# Patient Record
Sex: Female | Born: 1985 | Race: White | Hispanic: No | Marital: Single | Smoking: Never smoker
Health system: Southern US, Community
[De-identification: ages and names within clinical notes are randomized; demographics above are authoritative.]

## PROBLEM LIST (undated history)

## (undated) DIAGNOSIS — E7522 Gaucher disease: Secondary | ICD-10-CM

## (undated) DIAGNOSIS — T7840XA Allergy, unspecified, initial encounter: Secondary | ICD-10-CM

## (undated) HISTORY — DX: Gaucher disease: E75.22

## (undated) HISTORY — DX: Allergy, unspecified, initial encounter: T78.40XA

## (undated) HISTORY — PX: KNEE SURGERY: SHX244

---
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
## 2007-04-25 ENCOUNTER — Ambulatory Visit (HOSPITAL_COMMUNITY): Admission: RE | Admit: 2007-04-25 | Discharge: 2007-04-25 | Payer: Self-pay | Admitting: Orthopedic Surgery

## 2008-06-25 ENCOUNTER — Ambulatory Visit (HOSPITAL_COMMUNITY): Admission: RE | Admit: 2008-06-25 | Discharge: 2008-06-25 | Payer: Self-pay

## 2008-07-11 ENCOUNTER — Encounter: Admission: RE | Admit: 2008-07-11 | Discharge: 2008-07-11 | Payer: Self-pay | Admitting: Endocrinology

## 2008-09-02 ENCOUNTER — Encounter (HOSPITAL_COMMUNITY): Admission: RE | Admit: 2008-09-02 | Discharge: 2008-12-01 | Payer: Self-pay | Admitting: Geriatric Medicine

## 2009-08-04 ENCOUNTER — Ambulatory Visit (HOSPITAL_COMMUNITY): Admission: RE | Admit: 2009-08-04 | Discharge: 2009-08-04 | Payer: Self-pay | Admitting: Pediatrics

## 2009-08-06 ENCOUNTER — Encounter (HOSPITAL_COMMUNITY): Admission: RE | Admit: 2009-08-06 | Discharge: 2009-11-04 | Payer: Self-pay | Admitting: Pediatrics

## 2009-11-09 ENCOUNTER — Encounter (HOSPITAL_COMMUNITY): Admission: RE | Admit: 2009-11-09 | Discharge: 2010-02-07 | Payer: Self-pay | Admitting: Pediatrics

## 2010-03-21 ENCOUNTER — Encounter (HOSPITAL_COMMUNITY)
Admission: RE | Admit: 2010-03-21 | Discharge: 2010-06-19 | Payer: Self-pay | Source: Home / Self Care | Attending: Pediatrics | Admitting: Pediatrics

## 2010-06-12 ENCOUNTER — Encounter: Payer: Self-pay | Admitting: Pediatrics

## 2010-07-18 ENCOUNTER — Encounter (HOSPITAL_COMMUNITY): Payer: Self-pay | Attending: Pediatrics

## 2010-07-18 DIAGNOSIS — E756 Lipid storage disorder, unspecified: Secondary | ICD-10-CM | POA: Insufficient documentation

## 2010-08-02 ENCOUNTER — Encounter (HOSPITAL_COMMUNITY): Payer: Self-pay

## 2010-08-09 ENCOUNTER — Encounter (HOSPITAL_COMMUNITY): Payer: Self-pay

## 2010-10-07 NOTE — Op Note (Signed)
NAMEVINNIE, BOBST NO.:  [id number]   MEDICAL RECORD NO.:  [id number]          PATIENT TYPE:  AMB   LOCATION:  DSC                          FACILITY:  [facility]   PHYSICIAN:  Loreta Ave, M.D. 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   PROCEDURE:  Right knee examined under anesthesia, arthroscopy, chondroplasty  of patella, partial medial meniscectomy.  Assessment of tracking. Open  medial reefing of medial patellofemoral ligament and vastus medialis  obliquus.  Lateral retinacular release.   SURGEON:  Loreta Ave, M.D.   ASSISTANT:  Garnette Scheuermann, P.A.   ANESTHESIA:  General.   BLOOD LOSS:  Minimal.   TOURNIQUET TIME:  One hour.   SPECIMENS:  None.   CULTURES:  None.   COMPLICATIONS:  None.   DRESSING:  Soft compressive with knee immobilizer.   PROCEDURE:  The patient brought to the operating room and placed on the  operating table in the supine position.  After adequate anesthesia had been  obtained, both legs examined.  Confirmation of lateral instability, lateral  tracking, tethering, right knee much greater than left at the patellofemoral  joint.  All other ligaments stable to full motion.  Tourniquet applied,  prepped and draped in the usual sterile fashion.  Exsanguinated with the  elevation of the Esmarch, tourniquet inflated to 300 mmHg.  Two portals, one  each medial and lateral parapatellar.  Inflow catheter was attached to the  arthroscope.  The arthroscope was introduced  and the knee inspected.  Grade  3 focal traumatic chondromalacia peak of the patella debrided.  Very shallow  trochlea.  Confirmation of marked lateral instability with lateral tracking  and chronic enough that there was now lateral tethering, so I really could  not even reduce because of tightness laterally.  Articular cartilage in the  trochlea, medial and lateral compartments intact.  Flap tear, anteromedial  meniscus debrided.  Knee thoroughly inspected.  Instruments were completely  removed.  A longitudinal incision made medial to the patella.  Skin and  subcutaneous tissue divided.  Medial structures taken down in two layers.  The VMO, superficial fascia were taken from the top of the patella down to  the inferior pole and retracted so that I had a flap medial and lateral.  The underlying medial patellofemoral ligament identified, incised from above  the patella down to the joint line and then reefed a centimeter to tighten  this structure as it was very willowy, stretched out, and nonfunctional.  Sutured in place firmly with nonabsorbable Ethibond suture.  When that was  completed, I had marked improved stability and flexion better than 100  degrees without undue tension.  I then reefed the superficial structures in  the same manner with numerous nonabsorbable sutures.  Again, marked  improvement of stability with motion better than 100 degrees without too  much tension.  Arthroscope was reintroduced.  Confirmation of good  tightening of the medial side, but this really compressed the patellofemoral  joint because of the lateral tethering.  I therefore did a lateral release  from the vastus lateralis superiorly down to the lateral joint line  inferiorly with the hooked cautery.  When that was completed, I had  excellent tracking throughout viewed visually as well as arthroscopically.  Hemostasis had been obtained with cautery.  Portals were closed with nylon  and injected with  Marcaine as was the knee.  The incision was then closed  with subcutaneous, subcuticular Vicryl and Steri-Strips.  Margins were  injected with Marcaine.  Sterile compressive dressing applied.  Tourniquet  deflated and removed.  Knee immobilizer applied.  Anesthesia reversed.  Left  the recovery room.  Tolerated the surgery well with no complications.       DFM/MEDQ  D:  11/07/2004  T:  11/07/2004  Job:  [id number]

## 2010-10-07 NOTE — Op Note (Signed)
[facility]  Patient:    Kathryn Harrell, Kathryn Harrell                         MRN: [id number] Proc. Date: 12/14/00 Adm. Date:  [id number] Attending:  F[place] Pho Damodar CC:         Jeni Salles, M.D.   Operative Report  PREOPERATIVE DIAGNOSES: 1. Port-A-Cath of right chest. 2. Gauchers disease.  POSTOPERATIVE DIAGNOSES: 1. Port-A-Cath of right chest. 2. Gauchers disease.  PROCEDURE:  Removal of Port-A-Cath, right chest.  SURGEON:  Prabhakar D. Levie Heritage, M.D.  ASSISTANT:  Nurse.  ANESTHESIA:  Nurse.  DESCRIPTION OF PROCEDURE:  Under satisfactory general anesthesia, patient in supine position, the right chest region was thoroughly prepped and draped in the usual manner.  Previous scar over the right upper chest was excised by elliptical incision, skin and subcutaneous tissue incised.  Skin was undermined, Port-A-Cath was located.  The capsule of the Port-A-Cath was incised and Port-A-Cath was freed from the chest wall structures, and by gentle traction the catheter was removed intact with the Port-A-Cath.  The wound was irrigated, hemostasis accomplished.  Deeper layers were approximated with 4-0 Vicryl interrupted sutures, skin closed with 5-0 nylon interrupted sutures, appropriate dressing applied.  Throughout the procedure, patients vital signs remained stable.  Patient withstood the procedure well and was transferred to recovery room in satisfactory general condition. DD:  12/14/00 TD:  12/14/00 Job: [id number]

## 2011-12-12 ENCOUNTER — Other Ambulatory Visit (HOSPITAL_COMMUNITY): Payer: Self-pay | Admitting: *Deleted

## 2011-12-12 DIAGNOSIS — E756 Lipid storage disorder, unspecified: Secondary | ICD-10-CM

## 2011-12-18 ENCOUNTER — Ambulatory Visit (HOSPITAL_COMMUNITY)
Admission: RE | Admit: 2011-12-18 | Discharge: 2011-12-18 | Disposition: A | Discharge status: Home / Self Care | Payer: Self-pay | Source: Ambulatory Visit | Attending: Pediatrics | Admitting: Pediatrics

## 2011-12-18 ENCOUNTER — Ambulatory Visit (HOSPITAL_COMMUNITY)
Admission: RE | Admit: 2011-12-18 | Discharge: 2011-12-18 | Disposition: A | Discharge status: Home / Self Care | Payer: Self-pay | Source: Ambulatory Visit | Attending: *Deleted | Admitting: *Deleted

## 2011-12-18 ENCOUNTER — Other Ambulatory Visit (HOSPITAL_COMMUNITY): Payer: Self-pay | Admitting: *Deleted

## 2011-12-18 DIAGNOSIS — Z09 Encounter for follow-up examination after completed treatment for conditions other than malignant neoplasm: Secondary | ICD-10-CM | POA: Insufficient documentation

## 2011-12-18 DIAGNOSIS — E756 Lipid storage disorder, unspecified: Secondary | ICD-10-CM

## 2011-12-26 ENCOUNTER — Other Ambulatory Visit: Payer: Self-pay | Admitting: *Deleted

## 2011-12-26 ENCOUNTER — Other Ambulatory Visit (HOSPITAL_COMMUNITY): Payer: Self-pay | Admitting: *Deleted

## 2011-12-26 DIAGNOSIS — E229 Hyperfunction of pituitary gland, unspecified: Secondary | ICD-10-CM

## 2012-01-01 ENCOUNTER — Inpatient Hospital Stay (HOSPITAL_COMMUNITY)
Admission: RE | Admit: 2012-01-01 | Discharge: 2012-01-01 | Payer: Self-pay | Source: Ambulatory Visit | Attending: *Deleted | Admitting: *Deleted

## 2012-04-15 ENCOUNTER — Ambulatory Visit (INDEPENDENT_AMBULATORY_CARE_PROVIDER_SITE_OTHER): Payer: 59 | Admitting: Family Medicine

## 2012-04-15 VITALS — BP 98/72 | HR 78 | Temp 98.5°F | Resp 18 | Ht 61.75 in | Wt 185.6 lb

## 2012-04-15 DIAGNOSIS — Z23 Encounter for immunization: Secondary | ICD-10-CM

## 2012-04-15 DIAGNOSIS — Z0000 Encounter for general adult medical examination without abnormal findings: Secondary | ICD-10-CM

## 2012-04-15 DIAGNOSIS — E7522 Gaucher disease: Secondary | ICD-10-CM

## 2012-04-15 NOTE — Patient Instructions (Addendum)
Take care and good luck with your certification.

## 2012-04-15 NOTE — Progress Notes (Signed)
[facility] [address], [place] [address] [phone]  Date:  04/15/2012   Name:  Kathryn Harrell   DOB:  [birth year]   MRN:  [id number]  PCP:  Cyndee Brightly    Chief Complaint: Annual Exam   History of Present Illness:  Kathryn Harrell is a 26 y.o. 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 She therefore needs a PE and TB screening, as well as a varicella titer.  She was diagnosed with Gauchers disease at 26 years old. She takes Elelyso IV every 2 weeks and sees a doctor at [facility] every 6 months.  Otherwise she is generally healthy. The Gauchers disease does not generally effect her lifestyle or function  She had the chicken pox as a child and also has had her Hep B series.    LMP 03/24/12  There is no problem list on file for this patient.   Past Medical History  Diagnosis Date  . Allergy     Past Surgical History  Procedure Date  . Knee surgery     History  Substance Use Topics  . Smoking status: Never Smoker   . Smokeless tobacco: Not on file  . Alcohol Use: Not on file    No family history on file.  Allergies  Allergen Reactions  . Amoxicillin Hives  . Penicillins Hives    Medication list has been reviewed and updated.  No current outpatient prescriptions on file prior to visit.    Review of Systems:  As per HPI- otherwise negative.   Physical Examination: Filed Vitals:   04/15/12 1301  BP: 98/72  Pulse: 78  Temp: 98.5 F (36.9 C)  Resp: 18   Filed Vitals:   04/15/12 1301  Height: 5' 1.75" (1.568 m)  Weight: 185 lb 9.6 oz (84.188 kg)   Body mass index is 34.22 kg/(m^2). Ideal Body Weight: Weight in (lb) to have BMI = 25: 135.3   GEN: WDWN, NAD, Non-toxic, A & O x 3 HEENT:  Atraumatic, Normocephalic. Neck supple. No masses, No LAD. Bilateral TM wnl, oropharynx normal.  PEERL,EOMI.   Ears and Nose: No external deformity. CV: RRR, No M/G/R. No JVD. No thrill. No extra heart sounds. PULM: CTA B, no wheezes, crackles, rhonchi. No retractions. No resp. distress. No accessory muscle use. ABD: S, NT, ND, +BS. No rebound. No HSM. EXTR: No c/c/e.  Normal strength and sensation in all extremities, normal back flexion and ROM NEURO Normal gait.  PSYCH: Normally interactive. Conversant. Not depressed or anxious appearing.  Calm demeanor.    Tuberculosis Risk Questionnaire  1. Were you born outside the [place] in one of the following parts of the world:    [place], [place], [place], [place] or [place]?  No  2. Have you traveled outside the [place] and lived for more than one month in one of the following parts of the world:  [place], [place], [place], [place] or [place]?  No  3. Do you have a compromised immune system such as from any of the following conditions:  HIV/AIDS, organ or bone marrow transplantation, diabetes, immunosuppressive   medicines (e.g. Prednisone, Remicaide), leukemia, lymphoma, cancer of the   head or neck, gastrectomy or jejunal  bypass, end-stage renal disease (on   dialysis), or silicosis?  No    4. Have you ever done one of the following:    Used crack cocaine, injected illegal drugs, worked or resided in jail or prison,   worked or resided at a homeless shelter, or worked as a Research scientist (physical sciences) in   direct contact with patients?  No  5. Have you ever been exposed to anyone with infectious tuberculosis?  No   Tuberculosis Symptom Questionnaire  Do you currently have any of the following symptoms?  1. Unexplained cough lasting more than 3 weeks? No  Unexplained fever lasting more than 3 weeks. No   3. Night Sweats (sweating that leaves the bedclothes and sheets wet)   No  4. Shortness of Breath No  5.  Chest Pain No  6. Unintentional weight loss  No  7. Unexplained fatigue (very tired for no reason) No   Assessment and Plan: 1. Physical exam, annual   PE for [facility]- EMS program today.  TB screening negative, gave letter   Gave flu shot today.  Varicella and hep b titers pending.   Abbe Amsterdam, MD

## 2012-04-16 LAB — VARICELLA ZOSTER ANTIBODY, IGG: Varicella IgG: 2.73 {ISR} — ABNORMAL HIGH (ref <0.90)

## 2012-04-17 ENCOUNTER — Telehealth: Payer: Self-pay | Admitting: Family Medicine

## 2012-04-17 NOTE — Telephone Encounter (Signed)
Called Pt- LMOM stating she is immune to Hep B and Chicken pox. Mailed a copy of labs to Pt today. JF

## 2013-10-15 ENCOUNTER — Other Ambulatory Visit (HOSPITAL_COMMUNITY): Payer: Self-pay | Admitting: Pediatrics

## 2013-10-15 DIAGNOSIS — E7522 Gaucher disease: Secondary | ICD-10-CM

## 2013-10-23 ENCOUNTER — Ambulatory Visit (HOSPITAL_COMMUNITY)
Admission: RE | Admit: 2013-10-23 | Discharge: 2013-10-23 | Disposition: A | Discharge status: Home / Self Care | Payer: 59 | Source: Ambulatory Visit | Attending: Pediatrics | Admitting: Pediatrics

## 2013-10-23 ENCOUNTER — Ambulatory Visit (HOSPITAL_COMMUNITY): Admission: RE | Admit: 2013-10-23 | Payer: 59 | Source: Ambulatory Visit

## 2013-10-23 DIAGNOSIS — E756 Lipid storage disorder, unspecified: Secondary | ICD-10-CM | POA: Insufficient documentation

## 2013-10-23 DIAGNOSIS — E7522 Gaucher disease: Secondary | ICD-10-CM

## 2013-10-23 MED ORDER — GADOXETATE DISODIUM 0.25 MMOL/ML IV SOLN
9.0000 mL | Freq: Once | INTRAVENOUS | Status: AC | PRN
  Administered 2013-10-23: 9 mL via INTRAVENOUS

## 2013-11-10 ENCOUNTER — Ambulatory Visit (HOSPITAL_COMMUNITY)
Admission: RE | Admit: 2013-11-10 | Discharge: 2013-11-10 | Disposition: A | Discharge status: Home / Self Care | Payer: 59 | Source: Ambulatory Visit | Attending: Pediatrics | Admitting: Pediatrics

## 2013-11-10 DIAGNOSIS — Z13820 Encounter for screening for osteoporosis: Secondary | ICD-10-CM | POA: Insufficient documentation

## 2013-11-10 DIAGNOSIS — E7522 Gaucher disease: Secondary | ICD-10-CM

## 2013-11-10 DIAGNOSIS — E756 Lipid storage disorder, unspecified: Secondary | ICD-10-CM | POA: Insufficient documentation

## 2013-11-12 ENCOUNTER — Other Ambulatory Visit (HOSPITAL_COMMUNITY): Payer: Self-pay | Admitting: Pediatrics

## 2013-11-12 DIAGNOSIS — Z00129 Encounter for routine child health examination without abnormal findings: Secondary | ICD-10-CM

## 2013-11-13 ENCOUNTER — Ambulatory Visit (HOSPITAL_COMMUNITY)
Admission: RE | Admit: 2013-11-13 | Discharge: 2013-11-13 | Disposition: A | Discharge status: Home / Self Care | Payer: 59 | Source: Ambulatory Visit | Attending: Pediatrics | Admitting: Pediatrics

## 2013-11-13 DIAGNOSIS — I4510 Unspecified right bundle-branch block: Secondary | ICD-10-CM | POA: Insufficient documentation

## 2013-11-13 DIAGNOSIS — Z00129 Encounter for routine child health examination without abnormal findings: Secondary | ICD-10-CM | POA: Insufficient documentation

## 2013-11-13 DIAGNOSIS — I498 Other specified cardiac arrhythmias: Secondary | ICD-10-CM | POA: Insufficient documentation

## 2014-01-08 ENCOUNTER — Ambulatory Visit (INDEPENDENT_AMBULATORY_CARE_PROVIDER_SITE_OTHER): Payer: 59 | Admitting: Cardiovascular Disease

## 2014-01-08 ENCOUNTER — Encounter: Payer: Self-pay | Admitting: Cardiovascular Disease

## 2014-01-08 VITALS — BP 118/78 | HR 99 | Ht 63.0 in | Wt 202.0 lb

## 2014-01-08 DIAGNOSIS — R9431 Abnormal electrocardiogram [ECG] [EKG]: Secondary | ICD-10-CM | POA: Insufficient documentation

## 2014-01-08 DIAGNOSIS — E7522 Gaucher disease: Secondary | ICD-10-CM

## 2014-01-08 DIAGNOSIS — E756 Lipid storage disorder, unspecified: Secondary | ICD-10-CM

## 2014-01-08 DIAGNOSIS — Z01810 Encounter for preprocedural cardiovascular examination: Secondary | ICD-10-CM

## 2014-01-08 LAB — BASIC METABOLIC PANEL
BUN: 11 mg/dL (ref 6–23)
CO2: 26 mEq/L (ref 19–32)
Calcium: 9.2 mg/dL (ref 8.4–10.5)
Chloride: 105 mEq/L (ref 96–112)
Creatinine, Ser: 0.9 mg/dL (ref 0.4–1.2)
GFR: 78.52 mL/min (ref >60.00)
Glucose, Bld: 94 mg/dL (ref 70–99)
Potassium: 3.6 mEq/L (ref 3.5–5.1)
SODIUM: 135 meq/L (ref 135–145)

## 2014-01-08 NOTE — Patient Instructions (Addendum)
The current medical regimen is effective;  continue present plan and medications.  Please have blood work today (BMP)  Your physician has requested that you have a cardiac MRI. Cardiac MRI uses a computer to create images of your heart as its beating, producing both still and moving pictures of your heart and major blood vessels. For further information please visit [email or web address]. Please follow the instruction sheet given to you today for more information.  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 (8/25)

## 2014-01-08 NOTE — Assessment & Plan Note (Signed)
Re[facility].  Bone disease seems stable by MRI's.  She has had insurance issues and logistic issues with biweekly iv injection Rx.  Clearly would be beneficial to transition from iv to oral RX

## 2014-01-08 NOTE — Assessment & Plan Note (Addendum)
He ECG has transient and mild QT abnormalities.  She is asymptomatic with no palpitations or syncope and no high risk cardiac family history.  I am not familiar with this drug.  I have done a quick literature search and cardiac arrhythmias related to long QT seem very infrequent and not quoted as over 1% anywhere.  Her initial corrected QT of 444 is in normal range with other non significant ST changes and ICRBBB.  Her ECG today interestingly at much higher HR of 99 shows a slightly longer QTc of 469.  We will order a cardiac MRI to r/o structural heart disease and valve disease.  I have asked Dr Graciela HusbandsKlein to see patient and review after MRI.  I would be inclined to let her transition to drug since the benefit of stopping biweekly iv Rx is huge.  The question is should she have a provocative test with adenosine or flecainide or initially start the drug in the hospital given her borderline ECG.  Her complete lack of family history or symptoms again makes me think outpatient initiation of drug would be ok.

## 2014-01-08 NOTE — Progress Notes (Signed)
Patient ID: Kathryn Harrell, female   DOB: Mar 25, 1986, 28 y.o.   MRN: [id number]    28 yo [facility] Sabino NiemannKishnani.  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  There is no family history of sudden death or long QT .  She has been Rx with biweekly iv injections since age 19nine.  I believe this involves Elelyso enzyme replacement Rx.  She continues to have diffuse marrow processes by MRI and needs ongoing maintanence RX.  She wants to transition to oral  Cerdelga Initial  She takes NSAI's but no other drugs routinely and no drugs that prolong QT  ECG done 6/25 showed   SR rate 69 Low voltage ICRBBB and QT 412/QTc 441 Today ECG SR rate 99 nonspecific ST changes QT 366 QTc 469  Cerdelga has a warning for ECG changes and for potential cardiac arrhythmias and is not recommended for patients with preexisting heart disease or long QT syndrome   Labs done earlier this month  K 3.6, Hct 38 Cr .9  LDL 91 PLT 127    ROS: Denies fever, malais, weight loss, blurry vision, decreased visual acuity, cough, sputum, SOB, hemoptysis, pleuritic pain, palpitaitons, heartburn, abdominal pain, melena, lower extremity edema, claudication, or rash.  All other systems reviewed and negative   General: Affect appropriate Overweight white female  HEENT: normal Neck supple with no adenopathy JVP normal no bruits no thyromegaly Lungs clear with no wheezing and good diaphragmatic motion Heart:  S1/S2 no murmur,rub, gallop or click PMI normal Abdomen: benighn, BS positve, no tenderness, no AAA no bruit.  No HSM or HJR Distal pulses intact with no bruits No edema Neuro  non-focal Skin warm and dry No muscular weakness  Medications Current Outpatient Prescriptions  Medication Sig Dispense Refill  . Acetaminophen 500 MG coapsule Take by mouth.      . doxycycline (VIBRA-TABS) 100 MG tablet Take 100 mg by mouth.      Marland Kitchen. ibuprofen (ADVIL,MOTRIN) 200 MG tablet Take by mouth.      . Multiple Vitamin (MULTIVITAMIN) capsule Take by mouth.      . naproxen sodium (RA NAPROXEN SODIUM) 220 MG tablet Take by mouth.      . Omega-3 Fatty Acids (FISH OIL) 1000 MG CAPS Take by mouth.      . Taliglucerase Alfa (ELELYSO) 200 UNITS SOLR Inject 3,400 Units into the vein every 14 (fourteen) days.      . Taliglucerase Alfa (ELELYSO) 200 UNITS SOLR Inject into the vein.       No current facility-administered medications for this visit.    Allergies Amoxicillin; Penicillins; and Topiramate  Family History: No family history on file.  Social History: History   Social History  . Marital Status: Single    Spouse Name: N/A    Number of Children: N/A  . Years of Education: N/A   Occupational History  . Not on file.   Social History Main Topics  . Smoking status: Never Smoker   . Smokeless tobacco: Not on file  . Alcohol Use: Not on file  . Drug Use: Not on file  .  Sexual Activity: Not on file   Other Topics Concern  . Not on file   Social History Narrative  . No narrative on file    Electrocardiogram:  SR ICRBBB   Assessment and Plan

## 2014-01-09 ENCOUNTER — Ambulatory Visit (HOSPITAL_COMMUNITY)
Admission: RE | Admit: 2014-01-09 | Discharge: 2014-01-09 | Disposition: A | Discharge status: Home / Self Care | Payer: 59 | Source: Ambulatory Visit | Attending: Cardiovascular Disease | Admitting: Cardiovascular Disease

## 2014-01-09 DIAGNOSIS — E756 Lipid storage disorder, unspecified: Secondary | ICD-10-CM | POA: Insufficient documentation

## 2014-01-09 DIAGNOSIS — I079 Rheumatic tricuspid valve disease, unspecified: Secondary | ICD-10-CM | POA: Diagnosis not present

## 2014-01-09 DIAGNOSIS — E7522 Gaucher disease: Secondary | ICD-10-CM

## 2014-01-13 ENCOUNTER — Ambulatory Visit (INDEPENDENT_AMBULATORY_CARE_PROVIDER_SITE_OTHER): Payer: 59 | Admitting: Internal Medicine

## 2014-01-13 ENCOUNTER — Encounter: Payer: Self-pay | Admitting: Internal Medicine

## 2014-01-13 ENCOUNTER — Telehealth: Payer: Self-pay | Admitting: Internal Medicine

## 2014-01-13 VITALS — BP 113/81 | HR 96 | Ht 62.0 in | Wt 202.4 lb

## 2014-01-13 DIAGNOSIS — R9431 Abnormal electrocardiogram [ECG] [EKG]: Secondary | ICD-10-CM

## 2014-01-13 NOTE — Patient Instructions (Addendum)
Your physician recommends that you continue on your current medications as directed. Please refer to the Current Medication list given to you today.  Please call us and let us know about serial ECG scheduling. (will determine f/u with Dr. Graciela Husbands per [facility])

## 2014-01-13 NOTE — Progress Notes (Signed)
ELECTROPHYSIOLOGY CONSULT NOTE  Patient ID: Kathryn Harrell, MRN: [id number], DOB/AGE: 15-Feb-1986 27 y.o. Admit date: (Not on file) Date of Consult: 01/13/2014  Primary Physician: Cyndee Brightly Primary Cardiologist:    Chief Complaint:     HPI Kathryn Harrell is a 28 y.o. female  Referred for concerns of QT prolongation in the setting of anticipated oral therapy for Gaucher's disease for which she is on replacement therapy intravenously every 2 weeks for almost 20 years  There is no known cardiac involvement.   There is no family history of long QT syndrome. There is no history of syncope.  Interval cardiac MRI was normal    Past Medical History  Diagnosis Date  . Allergy   . Gaucher's disease     diagnosed age 88, managed by [facility]       Surgical History:  Past Surgical History  Procedure Laterality Date  . Knee surgery       Home Meds: Prior to Admission medications   Medication Sig Start Date End Date Taking? Authorizing Provider  Acetaminophen 500 MG coapsule Take by mouth.    Historical Provider, MD  doxycycline (VIBRA-TABS) 100 MG tablet Take 100 mg by mouth. 01/06/14 01/16/14  Historical Provider, MD  ibuprofen (ADVIL,MOTRIN) 200 MG tablet Take by mouth.    Historical Provider, MD  Multiple Vitamin (MULTIVITAMIN) capsule Take by mouth.    Historical Provider, MD  naproxen sodium (RA NAPROXEN SODIUM) 220 MG tablet Take by mouth.    Historical Provider, MD  Omega-3 Fatty Acids (FISH OIL) 1000 MG CAPS Take by mouth.    Historical Provider, MD  Taliglucerase Alfa (ELELYSO) 200 UNITS SOLR Inject 3,400 Units into the vein every 14 (fourteen) days.    Historical Provider, MD  Taliglucerase Alfa (ELELYSO) 200 UNITS SOLR Inject into the vein.    Historical Provider, MD       Allergies:  Allergies  Allergen Reactions  . Amoxicillin Hives    All cillins  . Penicillins Hives  . Topiramate Hives    History   Social History  . Marital Status: Single    Spouse Name:  N/A    Number of Children: N/A  . Years of Education: N/A   Occupational History  . Not on file.   Social History Main Topics  . Smoking status: Never Smoker   . Smokeless tobacco: Not on file  . Alcohol Use: Not on file  . Drug Use: Not on file  . Sexual Activity: Not on file   Other Topics Concern  . Not on file   Social History Narrative  . No narrative on file     No family history on file.   ROS:  Please see the history of present illness.     All other systems reviewed and negative.    Physical Exam:   Blood pressure 113/81, pulse 96, height  (1.575 m), weight 202 lb 6.4 oz (91.808 kg). General: Well developed, well nourished female in no acute distress. Head: Normocephalic, atraumatic, sclera non-icteric, no xanthomas, nares are without discharge. EENT: normal Lymph Nodes:  none Back: without scoliosis/kyphosis, no CVA tendersness Neck: Negative for carotid bruits. JVD not elevated. Lungs: Clear bilaterally to auscultation without wheezes, rales, or rhonchi. Breathing is unlabored. Heart: RRR with S1 S2. No * murmur , rubs, or gallops appreciated. Abdomen: Soft, non-tender, non-distended with normoactive bowel sounds. No hepatomegaly. No rebound/guarding. No obvious abdominal masses. Msk:  Strength and tone appear normal for age. Extremities: No clubbing or  cyanosis. No  edema.  Distal pedal pulses are 2+ and equal bilaterally. Skin: Warm and Dry Neuro: Alert and oriented X 3. CN III-XII intact Grossly normal sensory and motor function . Psych:  Responds to questions appropriately with a normal affect.      Labs: Cardiac Enzymes No results found for this basename: CKTOTAL, CKMB, TROPONINI,  in the last 72 hours CBC No results found for this basename: WBC,  HGB,  HCT,  MCV,  PLT   PROTIME: No results found for this basename: LABPROT, INR,  in the last 72 hours Chemistry   Recent Labs Lab 01/08/14 0942  NA 135  K 3.6  CL 105  CO2 26  BUN 11    CREATININE 0.9  CALCIUM 9.2  GLUCOSE 94   Lipids No results found for this basename: CHOL,  HDL,  LDLCALC,  TRIG   BNP No results found for this basename: probnp   Miscellaneous No results found for this basename: DDIMER    Radiology/Studies:  Mr Card Morphology Wo/w Cm  01/09/2014   CLINICAL DATA:  28 year old female with h/o Gaucher disease, long QT.Evaluate for structural heart disease.  EXAM: CARDIAC MRI  TECHNIQUE: The patient was scanned on a 1.5 Tesla GE magnet. A dedicated cardiac coil was used. Functional imaging was done using Fiesta sequences. 2,3, and 4 chamber views were done to assess for RWMA's. Modified Simpson's rule using a short axis stack was used to calculate an ejection fraction on a dedicated work Research officer, trade union. The patient received 30 cc of Multihance. After 10 minutes inversion recovery sequences were used to assess for infiltration and scar tissue.  CONTRAST:  30 cc  of Multihance  FINDINGS: 1. Normal left ventricular size, thickness and function (LVEF = 54%). No regional wall motion abnormalities.  LVEDD:  47 mm  LVESD:  34 mm  Basal anteroseptal wall:  8 mm  Basal inferolateral wall thickness:  10 mm  LVEDV:  118 ml  LVESV:  54 ml  SV:  64 ml  CO:  5.7 L/minute  2. Normal right ventricular size, thickness and borderline systolic function (RVEF = 45%).  No regional wall motion abnormalities.  RVEDV:  141 ml  RVESV:  78 ml  SV:  63 ml  CO:  5.6 L/minute  3. Mildly dilated pulmonary artery (31 mm). Normal size aortic root and ascending aorta.  4. Mild to moderate tricuspid regurgitation. Mild mitral regurgitation.  5.  Mildly dilated right atrium, normal size left atrium.  6.  No late gadolinium enhancement.  IMPRESSION: 1. Normal left ventricular size, thickness and function (LVEF = 54%). No regional wall motion abnormalities. No late gadolinium enhancement in the LV myocardium.  2. Normal right ventricular size, thickness and borderline systolic function  (RVEF = 45%). Mild to moderate tricuspid regurgitation, mildly dilated right atrium and mildly dilated pulmonary artery. These findings are suggestive of possible pulmonary hypertension.  3.  Mild mitral regurgitation.  Kathryn Harrell   Electronically Signed   By: Kathryn Harrell   On: 01/09/2014 13:53    EKG:  SR at 90 and Intervals 17/09/36  Assessment and Plan:  Gaucher's disease  QT prolongation by Bazetts calculation  Her QT interval in June was normal and by Frederica calculation is normal as of August 20 as well.  There is a protocol for electrocardiographic followup. We will support the patient is a efforts as much as we can. We have also discussed the potential interactions with 3A 6 and  2D6 inhibitors   Sherryl Manges

## 2014-01-13 NOTE — Telephone Encounter (Signed)
New message   patient was seen today , calling back to speak with nurse.

## 2014-01-14 NOTE — Telephone Encounter (Signed)
Follow up     Need to give a fax number to the nurse to fax a nurse regarding medication at [facility].  Please fax mri results and last 2 office notes to [phone] attn [facility].  Patient saw Dr Graciela Husbands yesterday.

## 2014-01-15 ENCOUNTER — Encounter: Payer: Self-pay | Admitting: Cardiovascular Disease

## 2014-01-20 ENCOUNTER — Ambulatory Visit: Payer: 59 | Admitting: Cardiology

## 2015-02-04 DIAGNOSIS — S8991XA Unspecified injury of right lower leg, initial encounter: Secondary | ICD-10-CM | POA: Insufficient documentation

## 2015-02-04 DIAGNOSIS — X58XXXA Exposure to other specified factors, initial encounter: Secondary | ICD-10-CM | POA: Insufficient documentation

## 2015-02-04 DIAGNOSIS — Z79899 Other long term (current) drug therapy: Secondary | ICD-10-CM | POA: Diagnosis not present

## 2015-02-04 DIAGNOSIS — Z8639 Personal history of other endocrine, nutritional and metabolic disease: Secondary | ICD-10-CM | POA: Insufficient documentation

## 2015-02-04 DIAGNOSIS — Y9289 Other specified places as the place of occurrence of the external cause: Secondary | ICD-10-CM | POA: Diagnosis not present

## 2015-02-04 DIAGNOSIS — Y93F2 Activity, caregiving, lifting: Secondary | ICD-10-CM | POA: Diagnosis not present

## 2015-02-04 DIAGNOSIS — Z880 Allergy status to penicillin: Secondary | ICD-10-CM | POA: Insufficient documentation

## 2015-02-04 DIAGNOSIS — Y990 Civilian activity done for income or pay: Secondary | ICD-10-CM | POA: Diagnosis not present

## 2015-02-05 ENCOUNTER — Encounter (HOSPITAL_COMMUNITY): Payer: Self-pay | Admitting: Emergency Medicine

## 2015-02-05 ENCOUNTER — Emergency Department (HOSPITAL_COMMUNITY): Payer: Worker's Compensation

## 2015-02-05 ENCOUNTER — Emergency Department (HOSPITAL_COMMUNITY)
Admission: EM | Admit: 2015-02-05 | Discharge: 2015-02-05 | Disposition: A | Discharge status: Home / Self Care | Payer: Worker's Compensation | Attending: Emergency Medicine | Admitting: Emergency Medicine

## 2015-02-05 DIAGNOSIS — M2391 Unspecified internal derangement of right knee: Secondary | ICD-10-CM

## 2015-02-05 MED ORDER — HYDROCODONE-ACETAMINOPHEN 5-325 MG PO TABS: 1.0000 | ORAL_TABLET | Freq: Four times a day (QID) | ORAL | Status: AC | PRN

## 2015-02-05 MED ORDER — IBUPROFEN 800 MG PO TABS: 800.0000 mg | ORAL_TABLET | Freq: Three times a day (TID) | ORAL | Status: AC | PRN

## 2015-02-05 MED ORDER — OXYCODONE-ACETAMINOPHEN 5-325 MG PO TABS
1.0000 | ORAL_TABLET | Freq: Once | ORAL | Status: AC
  Administered 2015-02-05: 1 via ORAL
  Filled 2015-02-05: qty 1

## 2015-02-05 NOTE — ED Provider Notes (Signed)
CSN: [id number]     Arrival date & time 02/04/15  2357 History   First MD Initiated Contact with Patient 02/05/15 0017     Chief Complaint  Patient presents with  . Knee Injury     (Consider location/radiation/quality/duration/timing/severity/associated sxs/prior Treatment) HPI  Kathryn Harrell is a 29 yo female who presents today with left knee pain. 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 The patients knees buckled and then she subsequently went to the ground. Her right knee twisted medial and she felt/ heard a pop. She is now complaining of constant shooting pain traveling down her knee cap into her right ankle. She has not taken anything for the pain. It is very painful to walk on.   Past Medical History  Diagnosis Date  . Allergy   . Gaucher's disease     diagnosed age 39, managed by [facility]    Past Surgical History  Procedure Laterality Date  . Knee surgery     No family history on file. Social History  Substance Use Topics  . Smoking status: Never Smoker   . Smokeless tobacco: None  . Alcohol Use: No   OB History    No data available     Review of Systems  ROS negative unless otherwise specified by HPI.   Allergies  Amoxicillin; Penicillins; and Topiramate  Home Medications   Prior to Admission medications   Medication Sig Start Date End Date Taking? Authorizing Provider  Acetaminophen 500 MG coapsule Take by mouth every 4 (four) hours as needed.     Historical Provider, MD  ibuprofen (ADVIL,MOTRIN) 200 MG tablet Take by mouth every 4 (four) hours as needed.     Historical Provider, MD  Multiple Vitamin (MULTIVITAMIN) capsule Take 1 capsule by mouth daily.     Historical Provider, MD  naproxen sodium (RA NAPROXEN SODIUM) 220 MG tablet Take by mouth as needed.     Historical Provider, MD  Omega-3 Fatty Acids (FISH OIL) 1000 MG CAPS Take 1 capsule by mouth daily.     Historical Provider, MD  Taliglucerase Alfa  (ELELYSO) 200 UNITS SOLR Inject 3,400 Units into the vein every 14 (fourteen) days.    Historical Provider, MD   BP 110/66 mmHg  Pulse 88  Temp(Src) 98 F (36.7 C) (Oral)  Resp 16  SpO2 97%  LMP 02/04/2015 Physical Exam  Constitutional: She is oriented to person, place, and time. She appears well-developed and well-nourished. No distress.  Pulmonary/Chest: Effort normal.  Musculoskeletal:       Right knee: She exhibits decreased range of motion. She exhibits no deformity, no laceration, no erythema, normal patellar mobility, no bony tenderness and normal meniscus. Tenderness found. Medial joint line and lateral joint line tenderness noted.  Neurological: She is alert and oriented to person, place, and time. She exhibits normal muscle tone. Coordination normal.  Skin: Skin is warm and dry.  Nursing note and vitals reviewed.   ED Course  Procedures (including critical care time)  Imaging Review Dg Knee Complete 4 Views Right  02/05/2015   CLINICAL DATA:  Twisting injury, knee pain. History of Gaucher's disease.  EXAM: RIGHT KNEE - COMPLETE 4+ VIEW  COMPARISON:  Femur MRI December 18, 2011  FINDINGS: There is no evidence of fracture, dislocation, or joint effusion. Mild widening distal femur, compatible with Gaucher's disease. There is no evidence of arthropathy or other focal bone abnormality. Soft tissues are unremarkable.  IMPRESSION:  No acute osseous process.   Electronically Signed   By: Awilda Metro M.D.   On: 02/05/2015 01:01   I have personally reviewed and evaluated these images and lab results as part of my medical decision-making.  Patient be placed in a knee immobilizer and referred to orthopedics.  Told to return here as needed.  Ice and elevate the knee      Charlestine Night, PA-C 02/05/15 0133  Loren Racer, MD 02/05/15 [phone]

## 2015-02-05 NOTE — ED Notes (Signed)
Pt stable, ambulatory, states understanding of discharge instructions 

## 2015-02-05 NOTE — ED Notes (Signed)
Patient transported to X-ray 

## 2015-02-05 NOTE — ED Notes (Signed)
Pt. injured her right knee while assisting  a pt .from stretcher to wheelchair this evening , reports pain at right knee radiating to right lower ankle / mild swelling .

## 2015-02-05 NOTE — Discharge Instructions (Signed)
Return here as needed.  Follow-up with your orthopedist.  Ice and elevate the knee

## 2015-02-05 NOTE — ED Notes (Signed)
Pt. Placed in knee immobilizer and given crutches. Pedal pulses +3 felt before and after application.

## 2016-12-23 IMAGING — DX DG KNEE COMPLETE 4+V*R*
4 series · 4 of 4 positions shown · non-contrast
Comparison: Femur MRI December 18, 2011

CLINICAL DATA: Twisting injury, knee pain. History of Gaucher's
disease.

EXAM:
RIGHT KNEE - COMPLETE 4+ VIEW

[knee ap]
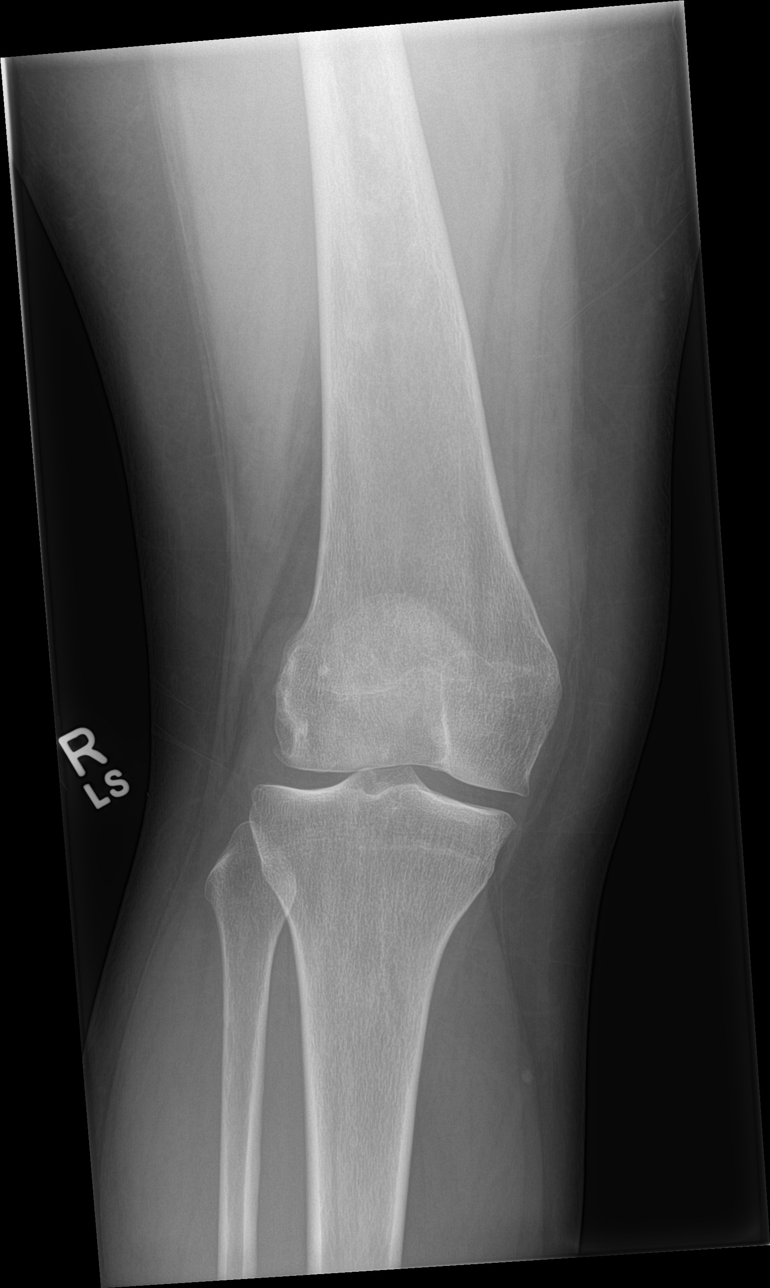

[tunnel]
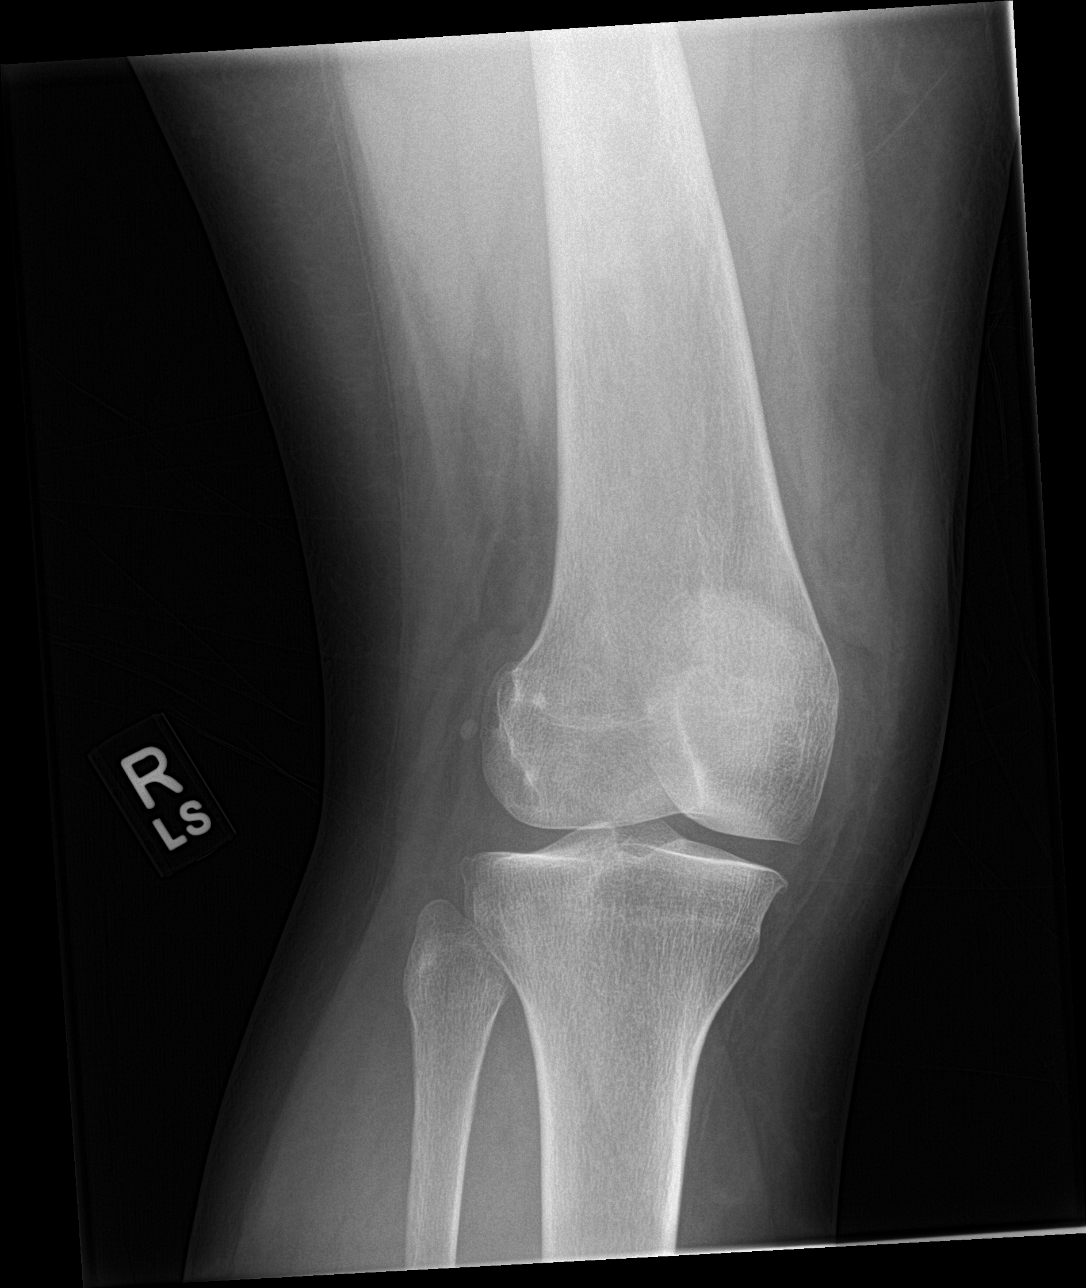

[knee lat]
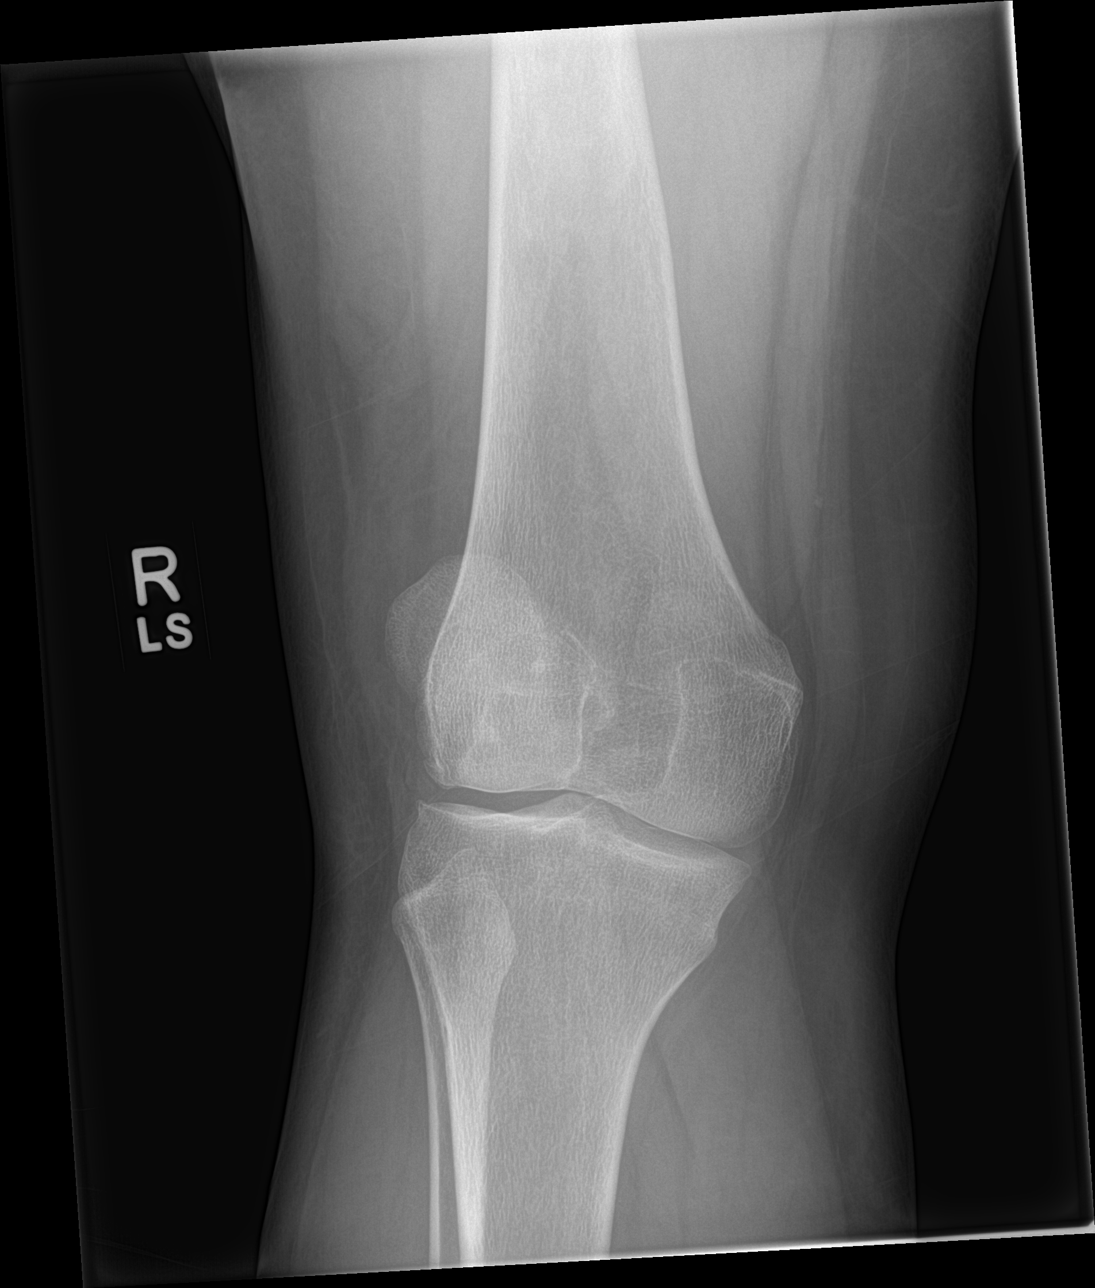

[knee sunrise]
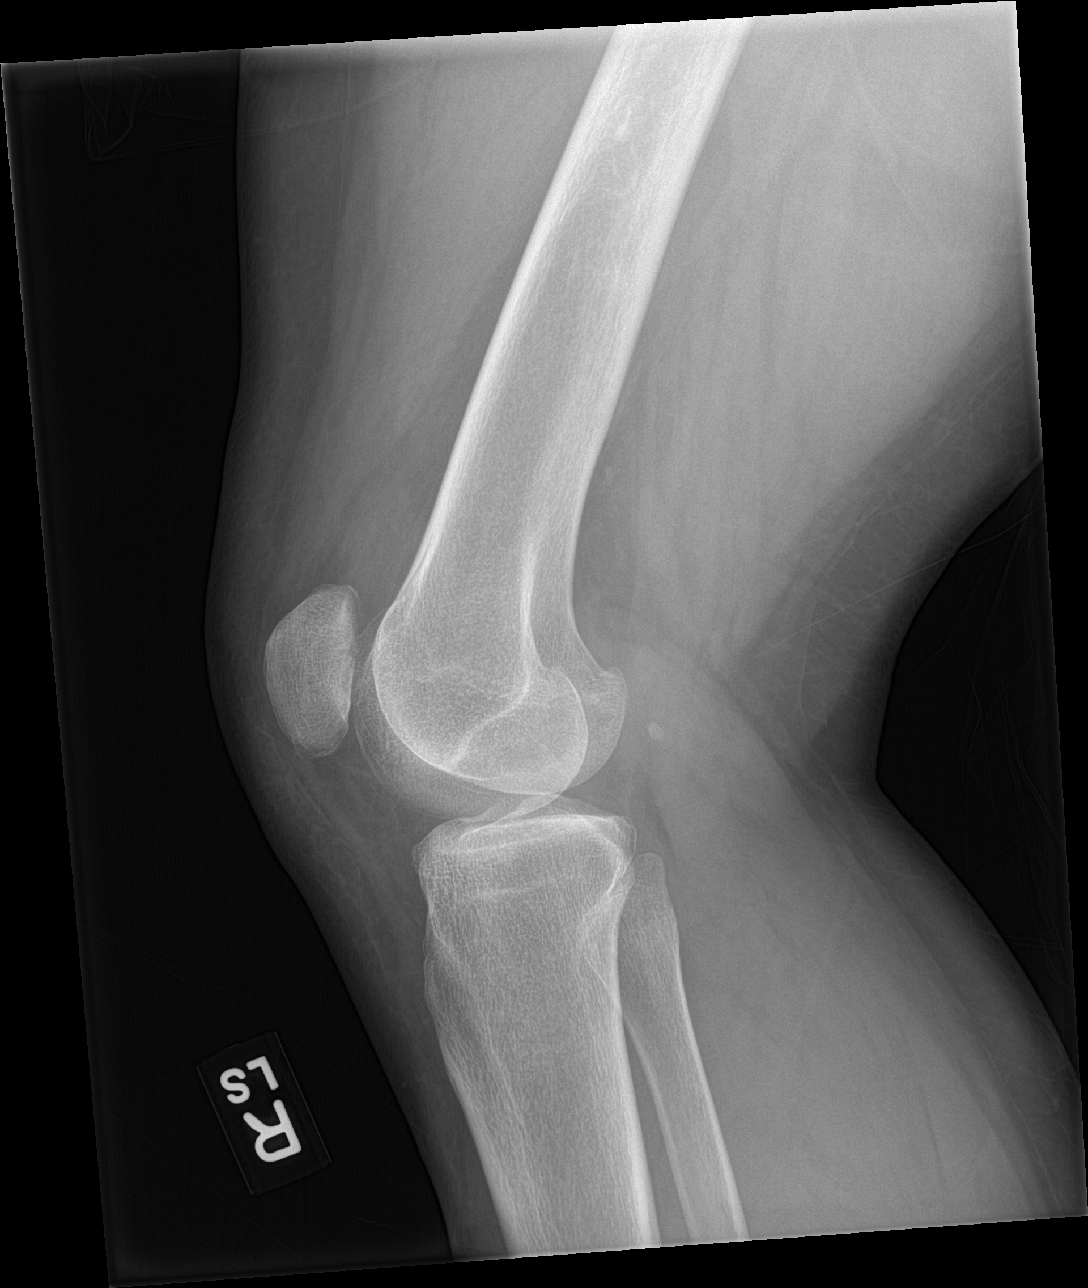

[4 of 4 positions shown; findings below may reference images not displayed]

FINDINGS: There is no evidence of fracture, dislocation, or joint effusion.
Mild widening distal femur, compatible with Gaucher's disease. There
is no evidence of arthropathy or other focal bone abnormality. Soft
tissues are unremarkable.
IMPRESSION: No acute osseous process.
